# Patient Record
Sex: Female | Born: 1995 | Race: Black or African American | Hispanic: No | Marital: Single | Smoking: Never smoker
Health system: Southern US, Community
[De-identification: ages and names within clinical notes are randomized; demographics above are authoritative.]

## PROBLEM LIST (undated history)

## (undated) DIAGNOSIS — Z789 Other specified health status: Secondary | ICD-10-CM

## (undated) HISTORY — DX: Other specified health status: Z78.9

## (undated) HISTORY — PX: NO PAST SURGERIES: SHX2092

---

## 2014-03-14 ENCOUNTER — Emergency Department (HOSPITAL_COMMUNITY)
Admission: EM | Admit: 2014-03-14 | Discharge: 2014-03-14 | Disposition: A | Discharge status: Home / Self Care | Payer: No Typology Code available for payment source | Attending: Emergency Medicine | Admitting: Emergency Medicine

## 2014-03-14 ENCOUNTER — Emergency Department (HOSPITAL_COMMUNITY): Payer: No Typology Code available for payment source

## 2014-03-14 ENCOUNTER — Encounter (HOSPITAL_COMMUNITY): Payer: Self-pay | Admitting: *Deleted

## 2014-03-14 DIAGNOSIS — Y998 Other external cause status: Secondary | ICD-10-CM | POA: Insufficient documentation

## 2014-03-14 DIAGNOSIS — S80212A Abrasion, left knee, initial encounter: Secondary | ICD-10-CM | POA: Insufficient documentation

## 2014-03-14 DIAGNOSIS — Y92410 Unspecified street and highway as the place of occurrence of the external cause: Secondary | ICD-10-CM | POA: Diagnosis not present

## 2014-03-14 DIAGNOSIS — Y9389 Activity, other specified: Secondary | ICD-10-CM | POA: Insufficient documentation

## 2014-03-14 DIAGNOSIS — M25562 Pain in left knee: Secondary | ICD-10-CM

## 2014-03-14 DIAGNOSIS — S8992XA Unspecified injury of left lower leg, initial encounter: Secondary | ICD-10-CM | POA: Diagnosis present
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
## 2014-03-14 NOTE — Discharge Instructions (Signed)
Please call your doctor for a followup appointment within 24-48 hours. When you talk to your doctor please let them know that you were seen in the emergency department and have them acquire all of your records so that they can discuss the findings with you and formulate a treatment plan to fully care for your new and ongoing problems. Please call and set-up an appointment with your primary care provider Please call and set-up an appointment with orthopedics to follow up regarding left knee pain  Please rest and stay hydrated Please keep knee sleeve on for comfort purposes Please rest, ice, elevate - toes above nose  Please continue to monitor symptoms closely and if symptoms are to worsen or change (fever greater than 101, chills, sweating, nausea, vomiting, chest pain, shortness of breathe, difficulty breathing, weakness, numbness, tingling, worsening or changes to pain pattern, headache, dizziness, visual changes, fall, injury, inability to control urine or bowel movements, back pain, neck pain, neck stiffness) please report back to the Emergency Department immediately.    Arthralgia Your caregiver has diagnosed you as suffering from an arthralgia. Arthralgia means there is pain in a joint. This can come from many reasons including:  Bruising the joint which causes soreness (inflammation) in the joint.  Wear and tear on the joints which occur as we grow older (osteoarthritis).  Overusing the joint.  Various forms of arthritis.  Infections of the joint. Regardless of the cause of pain in your joint, most of these different pains respond to anti-inflammatory drugs and rest. The exception to this is when a joint is infected, and these cases are treated with antibiotics, if it is a bacterial infection. HOME CARE INSTRUCTIONS   Rest the injured area for as long as directed by your caregiver. Then slowly start using the joint as directed by your caregiver and as the pain allows. Crutches as  directed may be useful if the ankles, knees or hips are involved. If the knee was splinted or casted, continue use and care as directed. If an stretchy or elastic wrapping bandage has been applied today, it should be removed and re-applied every 3 to 4 hours. It should not be applied tightly, but firmly enough to keep swelling down. Watch toes and feet for swelling, bluish discoloration, coldness, numbness or excessive pain. If any of these problems (symptoms) occur, remove the ace bandage and re-apply more loosely. If these symptoms persist, contact your caregiver or return to this location.  For the first 24 hours, keep the injured extremity elevated on pillows while lying down.  Apply ice for 15-20 minutes to the sore joint every couple hours while awake for the first half day. Then 03-04 times per day for the first 48 hours. Put the ice in a plastic bag and place a towel between the bag of ice and your skin.  Wear any splinting, casting, elastic bandage applications, or slings as instructed.  Only take over-the-counter or prescription medicines for pain, discomfort, or fever as directed by your caregiver. Do not use aspirin immediately after the injury unless instructed by your physician. Aspirin can cause increased bleeding and bruising of the tissues.  If you were given crutches, continue to use them as instructed and do not resume weight bearing on the sore joint until instructed. Persistent pain and inability to use the sore joint as directed for more than 2 to 3 days are warning signs indicating that you should see a caregiver for a follow-up visit as soon as possible. Initially, a  hairline fracture (break in bone) may not be evident on X-rays. Persistent pain and swelling indicate that further evaluation, non-weight bearing or use of the joint (use of crutches or slings as instructed), or further X-rays are indicated. X-rays may sometimes not show a small fracture until a week or 10 days later.  Make a follow-up appointment with your own caregiver or one to whom we have referred you. A radiologist (specialist in reading X-rays) may read your X-rays. Make sure you know how you are to obtain your X-ray results. Do not assume everything is normal if you do not hear from [place]. SEEK MEDICAL CARE IF: Bruising, swelling, or pain increases. SEEK IMMEDIATE MEDICAL CARE IF:   Your fingers or toes are numb or blue.  The pain is not responding to medications and continues to stay the same or get worse.  The pain in your joint becomes severe.  You develop a fever over 102 F (38.9 C).  It becomes impossible to move or use the joint. MAKE SURE YOU:   Understand these instructions.  Will watch your condition.  Will get help right away if you are not doing well or get worse. Document Released: 02/25/2005 Document Revised: 05/20/2011 Document Reviewed: 10/14/2007 [facility] Patient Information 2015 [place], [place]. This information is not intended to replace advice given to you by your health care provider. Make sure you discuss any questions you have with your health care provider.   Emergency Department Resource Guide 1) Find a Doctor and Pay Out of Pocket Although you won't have to find out who is covered by your insurance plan, it is a good idea to ask around and get recommendations. You will then need to call the office and see if the doctor you have chosen will accept you as a new patient and what types of options they offer for patients who are self-pay. Some doctors offer discounts or will set up payment plans for their patients who do not have insurance, but you will need to ask so you aren't surprised when you get to your appointment.  2) Contact Your Local Health Department Not all health departments have doctors that can see patients for sick visits, but many do, so it is worth a call to see if yours does. If you don't know where your local health department is, you can check in your  phone book. The CDC also has a tool to help you locate your state's health department, and many state websites also have listings of all of their local health departments.  3) Find a Walk-in Clinic If your illness is not likely to be very severe or complicated, you may want to try a walk in clinic. These are popping up all over the country in pharmacies, drugstores, and shopping centers. They're usually staffed by nurse practitioners or physician assistants that have been trained to treat common illnesses and complaints. They're usually fairly quick and inexpensive. However, if you have serious medical issues or chronic medical problems, these are probably not your best option.  No Primary Care Doctor: - Call Health Connect at  [phone] - they can help you locate a primary care doctor that  accepts your insurance, provides certain services, etc. - Physician Referral Service- [phone]  Chronic Pain Problems: Organization         Address  Phone   Notes  [facility]  [phone] Patients need to be referred by their primary care doctor.   Medication Assistance: Organization  Address  Phone   Notes  [facility] [address] [place], [place] [address] [phone] --Must be a resident of [facility] -- Must have NO insurance coverage whatsoever (no Medicaid/ Medicare, etc.) -- The pt. MUST have a primary care doctor that directs their care regularly and follows them in the community   MedAssist  [phone]   [employer]  [phone]    Agencies that provide inexpensive medical care: Organization         Address  Phone   Notes  [place]  [phone]   [facility]    [phone]   [facility] [place], [place] [address] [phone]   [place] [address], [place] [phone]   [facility]    [phone]   [facility]    [phone]   [place] [address], [place] Phone:  [phone], Fax:  [phone] Hours of Operation:  9 am - 6 pm, M-F.  Also accepts Medicaid/Medicare and self-pay.  [facility] for [place], [address], [place] Phone: [phone], Fax: [phone]. Hours of Operation:  8:30 am - 5:30 pm, M-F.  Also accepts Medicaid and self-pay.  [facility] [place] [address], [place] Phone: [phone]   [place], [place], [place] [phone], Ext. [phone] Mondays & Thursdays: 7-9 AM.  First 15 patients are seen on a first come, first serve basis.    [place] Providers:  Organization         Address  Phone   Notes  [facility] [address], [place] [phone] Also accepts self-pay patients.  [facility] [address], [place]  [phone]   [place], [address], [place] [phone]   [facility] [address] [place] [phone]   Lucianne Lei [address], [place]   [phone] Only accepts [place] Access [place] patients after they have their name applied to their card.   Self-Pay (no insurance) in [facility]:  Organization         Address  Phone   Notes  Sickle Cell Patients, [facility] [place] [phone]   [facility] [place] [phone]   [facility] [place]  [place], [place], [place] [phone]   [facility]/Dr. Osei-Bonsu  [address] [place] or [address], [place] [phone] Phone number for both [place] and [place] locations is the same.    [facility] [address], [place] [phone]   [facility] [address], [place] or [address] [phone]   [facility] [address] [place] [phone], phone; [phone], fax Sees patients 1st and 3rd Saturday of every month.  Must not qualify for public or private insurance (i.e. Medicaid, Medicare, [place] Health Choice, Veterans' Benefits)  Household income should be no more than 200% of the poverty level The clinic cannot treat you if you are pregnant or think  you are pregnant  Sexually transmitted diseases are not treated at the clinic.    Dental Care: Organization         Address  Phone  Notes  [facility] [place] [phone] Accepts children up to age 18 who are enrolled in [place] or [place]; pregnant women with a Medicaid card; and children who have applied for Medicaid or [place] Health Choice, but were declined, whose parents can pay a reduced fee at time of service.  [facility] of [facility]  [address], [place] [phone] Accepts children up to age 65 who are enrolled in [place] or [place]; pregnant women with a Medicaid card; and children who have applied for Medicaid or [place] Health Choice, but were declined, whose parents can pay a reduced fee at time of service.  [place] Adult [facility] Access PROGRAM  [place] [phone] Patients are seen by appointment only. Walk-ins are not accepted. [place] will see patients 62 years of age and older. Monday - Tuesday (8am-5pm) Most Wednesdays (8:30-5pm) $30 per visit, cash only  [facility]  [address], [facility] [phone] Patients are seen by appointment only. Walk-ins are not accepted. [place] will see patients 59  years of age and older. One Wednesday Evening (Monthly: Volunteer Based).  $30 per visit, cash only  [place]  [phone] for adults; Children under age 77, call Graduate Pediatric Dentistry at [phone]. Children aged 59-14, please call [phone] to request a pediatric application.  Dental services are provided in all areas of dental care including fillings, crowns and bridges, complete and partial dentures, implants, gum treatment, root canals, and extractions. Preventive care is also provided. Treatment is provided to both adults and children. Patients are selected via a lottery and there is often a waiting list.   [facility] [address], [place]  [phone] [email or web address]   [facility] [address] [place], [place] [phone], Ext. [phone] Second and Fourth Thursday of each month, opens at 6:30 AM; Clinic ends at 9 AM.  Patients are seen on a first-come first-served basis, and a limited number are seen during each clinic.   [facility]  [address] [place], [place] [phone]   Eligibility Requirements You must have lived in [place], [place], or [place] counties for at least the last three months.   You cannot be eligible for state or federal sponsored [employer], including [employer], [place], or [employer].   You generally cannot be eligible for healthcare insurance through your employer.    How to apply: Eligibility screenings are held every Tuesday and Wednesday afternoon from 1:00 pm until 4:00 pm. You do not need an appointment for the interview!  [facility] [address] [place], [place]   [place]  [place] Department  [place]  [phone]    Behavioral Health Resources in the Community: Intensive Outpatient  Programs Organization         Address  Phone  Notes  [place]. [address] [place], [place] [phone]   [facility] [address], [place], [place]   ADS: Alcohol & Drug Svcs 119  [address] [place], [place]   [place] [phone] [address]  [place], [place] or [phone]   Substance Abuse Resources Organization         Address  Phone  Notes  Al[facility] Drug Services  [phone]   [place]  [phone]   The [place]   Chinita Pester  [phone]   Residential & Outpatient Substance Abuse Program  [phone]   Psychological Services Organization         Address  Phone  Notes  [facility] [place]  [place]  [phone]   [place] [address], [place] or [phone]    Mobile Crisis Teams Organization         Address  Phone  Notes  [facility], Mobile Crisis Care Unit  [phone]   [facility]  [address] [place], [place]   [place] [address] [place] [phone]    Self-Help/Support Groups Organization         Address  Phone             Notes  [place]. of [place] - variety of support groups  [place] Call for more information  Narcotics Anonymous (NA), [facility] [address], [employer] [place]  2 meetings at this location   Special educational needs teacher         Address  Phone  Notes  ASAP Residential Treatment [place],    [place]  [phone]   [facility]  [address] [place] [id number], [place], [place]   [place], [place] [phone] Admissions: 8am-3pm M-F  Incentives Substance Medicine [place] [address],    [place], [place]  [phone]   [facility] [address] [place], [place], [place]   [facility] [address]  [place], [place]   Insight Programs - Intensive Outpatient [address] Kristeen [address], [place], [place]   [facility] [address].,  [place], [place] [phone] or [phone]   Residential Treatment Services (RTS) [address] [place], [place] Accepts Medicaid  [facility] [place] [address],  [place] [phone] Substance Abuse/Addiction Treatment   [facility] Organization         Address  Phone  Notes  [facility]  [phone]   Domenic Schwab, PhD [address] [place], [place]   [phone] or [phone]   [place], [place] [phone]   [facility] [address] [place], [place] [phone] Insurance/Medicaid/sponsorship through [facility] and Families [address] [place]                                    [place], [place] [phone] [place] [address], [place] [phone]    Dr. Adele Schilder  [phone]   [facility] Dept. 1) [address] [place] 2) [place] 3)  [place] [address], [place] [phone]  [phone]   [place] (  [phone] [phone] or [phone] (After Hours)
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
## 2015-05-31 ENCOUNTER — Emergency Department (HOSPITAL_BASED_OUTPATIENT_CLINIC_OR_DEPARTMENT_OTHER): Payer: Self-pay

## 2015-05-31 ENCOUNTER — Emergency Department (HOSPITAL_BASED_OUTPATIENT_CLINIC_OR_DEPARTMENT_OTHER)
Admission: EM | Admit: 2015-05-31 | Discharge: 2015-05-31 | Disposition: A | Discharge status: Home / Self Care | Payer: Self-pay | Attending: Emergency Medicine | Admitting: Emergency Medicine

## 2015-05-31 ENCOUNTER — Encounter (HOSPITAL_BASED_OUTPATIENT_CLINIC_OR_DEPARTMENT_OTHER): Payer: Self-pay | Admitting: Emergency Medicine

## 2015-05-31 DIAGNOSIS — Z3202 Encounter for pregnancy test, result negative: Secondary | ICD-10-CM | POA: Insufficient documentation

## 2015-05-31 DIAGNOSIS — W19XXXA Unspecified fall, initial encounter: Secondary | ICD-10-CM

## 2015-05-31 DIAGNOSIS — Y92481 Parking lot as the place of occurrence of the external cause: Secondary | ICD-10-CM | POA: Insufficient documentation

## 2015-05-31 DIAGNOSIS — S42402A Unspecified fracture of lower end of left humerus, initial encounter for closed fracture: Secondary | ICD-10-CM

## 2015-05-31 DIAGNOSIS — W010XXA Fall on same level from slipping, tripping and stumbling without subsequent striking against object, initial encounter: Secondary | ICD-10-CM | POA: Insufficient documentation

## 2015-05-31 DIAGNOSIS — S42352A Displaced comminuted fracture of shaft of humerus, left arm, initial encounter for closed fracture: Secondary | ICD-10-CM | POA: Insufficient documentation

## 2015-05-31 DIAGNOSIS — Y9389 Activity, other specified: Secondary | ICD-10-CM | POA: Insufficient documentation

## 2015-05-31 DIAGNOSIS — Y998 Other external cause status: Secondary | ICD-10-CM | POA: Insufficient documentation

## 2015-05-31 LAB — PREGNANCY, URINE: Preg Test, Ur: NEGATIVE

## 2015-05-31 MED ORDER — HYDROCODONE-ACETAMINOPHEN 5-325 MG PO TABS: 1.0000 | ORAL_TABLET | ORAL | Status: DC | PRN

## 2015-05-31 MED ORDER — HYDROCODONE-ACETAMINOPHEN 5-325 MG PO TABS
2.0000 | ORAL_TABLET | Freq: Once | ORAL | Status: AC
  Administered 2015-05-31: 2 via ORAL
  Filled 2015-05-31: qty 2

## 2015-05-31 NOTE — Discharge Instructions (Signed)
Humerus Fracture Treated With Immobilization °The humerus is the large bone in your upper arm. You have a broken (fractured) humerus. These fractures are easily diagnosed with X-rays. °TREATMENT  °Simple fractures which will heal without disability are treated with simple immobilization. Immobilization means you will wear a cast, splint, or sling. You have a fracture which will do well with immobilization. The fracture will heal well simply by being held in a good position until it is stable enough to begin range of motion exercises. Do not take part in activities which would further injure your arm.  °HOME CARE INSTRUCTIONS  °· Put ice on the injured area. °¨ Put ice in a plastic bag. °¨ Place a towel between your skin and the bag. °¨ Leave the ice on for 15-20 minutes, 03-04 times a day. °· If you have a cast: °¨ Do not scratch the skin under the cast using sharp or pointed objects. °¨ Check the skin around the cast every day. You may put lotion on any red or sore areas. °¨ Keep your cast dry and clean. °· If you have a splint: °¨ Wear the splint as directed. °¨ Keep your splint dry and clean. °¨ You may loosen the elastic around the splint if your fingers become numb, tingle, or turn cold or blue. °· If you have a sling: °¨ Wear the sling as directed. °· Do not put pressure on any part of your cast or splint until it is fully hardened. °· Your cast or splint can be protected during bathing with a plastic bag. Do not lower the cast or splint into water. °· Only take over-the-counter or prescription medicines for pain, discomfort, or fever as directed by your caregiver. °· Do range of motion exercises as instructed by your caregiver. °· Follow up as directed by your caregiver. This is very important in order to avoid permanent injury or disability and chronic pain. °SEEK IMMEDIATE MEDICAL CARE IF:  °· Your skin or nails in the injured arm turn blue or gray. °· Your arm feels cold or numb. °· You develop severe pain  in the injured arm. °· You are having problems with the medicines you were given. °MAKE SURE YOU:  °· Understand these instructions. °· Will watch your condition. °· Will get help right away if you are not doing well or get worse. °  °This information is not intended to replace advice given to you by your health care provider. Make sure you discuss any questions you have with your health care provider. °  °Document Released: 06/03/2000 Document Revised: 03/18/2014 Document Reviewed: 07/20/2014 °Elsevier Interactive Patient Education ©2016 [employer] ° °

## 2015-05-31 NOTE — ED Notes (Signed)
Pt states she tripped and fell on pants and injured her left elbow. Decreased ROM, skin intact.

## 2015-05-31 NOTE — ED Provider Notes (Signed)
CSN: [id number]     Arrival date & time 05/31/15  1859 History  By signing my name below, I, Miranda Ortiz, attest that this documentation has been prepared under the direction and in the presence of Loren Racer, MD. Electronically Signed: Ronney Ortiz, ED Scribe. 05/31/2015. 10:11 PM.    Chief Complaint  Patient presents with  . Fall  . Elbow Pain   The history is provided by the patient. No language interpreter was used.    HPI Comments: Miranda Ortiz is a 20 y.o. female who presents to the Emergency Department complaining of sudden-onset, constant, severe, aching left humerus pain after tripping and falling on her pants in the parking lot and landing with most of the impact on her left elbow about 4 hours ago, at 6 PM. Movement and palpation exacerbate her pain. She denies any shoulder pain, numbness, or tingling.   History reviewed. No pertinent past medical history. History reviewed. No pertinent past surgical history. No family history on file. Social History  Substance Use Topics  . Smoking status: Never Smoker   . Smokeless tobacco: None  . Alcohol Use: No   OB History    No data available     Review of Systems  Constitutional: Negative for fever and chills.  Gastrointestinal: Negative for abdominal pain.  Musculoskeletal: Positive for arthralgias (left elbow pain). Negative for neck pain and neck stiffness.  Skin: Negative for rash and wound.  Neurological: Negative for weakness and numbness.  All other systems reviewed and are negative.  Allergies  Review of patient's allergies indicates no known allergies.  Home Medications   Prior to Admission medications   Medication Sig Start Date End Date Taking? Authorizing Provider  HYDROcodone-acetaminophen (NORCO) 5-325 MG tablet Take 1-2 tablets by mouth every 4 (four) hours as needed for severe pain. 05/31/15   Loren Racer, MD   BP 100/60 mmHg  Pulse 71  Temp(Src) 99.5 F (37.5 C) (Oral)  Resp 18  Ht  (1.6  m)  Wt 127 lb 9 oz (57.862 kg)  BMI 22.60 kg/m2  SpO2 100%  LMP 05/31/2015 Physical Exam  Constitutional: She is oriented to person, place, and time. She appears well-developed and well-nourished. No distress.  HENT:  Head: Normocephalic and atraumatic.  Eyes: EOM are normal. Pupils are equal, round, and reactive to light.  Neck: Normal range of motion. Neck supple.  Cardiovascular: Normal rate and regular rhythm.   Pulmonary/Chest: Effort normal and breath sounds normal.  Abdominal: Soft. Bowel sounds are normal.  Musculoskeletal: Normal range of motion. She exhibits tenderness. She exhibits no edema.  Patient has midshaft humeral tenderness on the left with swelling present. No tenderness over the left elbow. Radial pulse is intact. Compartments are soft.  Neurological: She is alert and oriented to person, place, and time.  Normal left grip strength. Sensation fully intact.  Skin: Skin is warm and dry. No rash noted. No erythema.  Psychiatric: She has a normal mood and affect. Her behavior is normal.  Nursing note and vitals reviewed.   ED Course  Procedures (including critical care time)  DIAGNOSTIC STUDIES: Oxygen Saturation is 100% on RA, normal by my interpretation.    COORDINATION OF CARE: 10:09 PM - Discussed treatment plan with pt at bedside which includes awaiting x-ray results. Pt verbalized understanding and agreed to plan.   Labs Review Labs Reviewed  PREGNANCY, URINE    Imaging Review Dg Humerus Left  05/31/2015  CLINICAL DATA:  Fall tonight. Left humerus pain with  swelling. Initial encounter. EXAM: LEFT HUMERUS - 2+ VIEW COMPARISON:  None. FINDINGS: There is an oblique, comminuted midshaft humerus fracture. The main distal fragment demonstrates approximately 1 shaft width anterior displacement and 0.5 shaft width medial displacement relative to the main proximal fragment. The shoulder and elbow are grossly located. No suspicious lytic or blastic osseous lesion is  identified. No focal soft tissue abnormality. IMPRESSION: Comminuted, displaced midshaft left humerus fracture. Electronically Signed   By: Sebastian AcheAllen  Grady M.D.   On: 05/31/2015 22:20   I have personally reviewed and evaluated these images and lab results as part of my medical decision-making.   EKG Interpretation None      MDM   Final diagnoses:  Humerus distal fracture, left, closed, initial encounter    .I personally performed the services described in this documentation, which was scribed in my presence. The recorded information has been reviewed and is accurate.   Discussed x-ray results with Dr. Eulah PontMurphy. Recommends coaptation splint and having the patient follow-up in his office on Monday. Splint applied in the emergency department and discharged home with pain medication. Return precautions given.    Loren Raceravid Jaxxon Naeem, MD 05/31/15 2241
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
## 2016-11-12 IMAGING — CR DG HUMERUS 2V *L*
2 series · 2 of 2 positions shown · non-contrast
Comparison: None.

CLINICAL DATA: Fall tonight. Left humerus pain with swelling.
Initial encounter.

EXAM:
LEFT HUMERUS - 2+ VIEW

[w humerus lat left *]
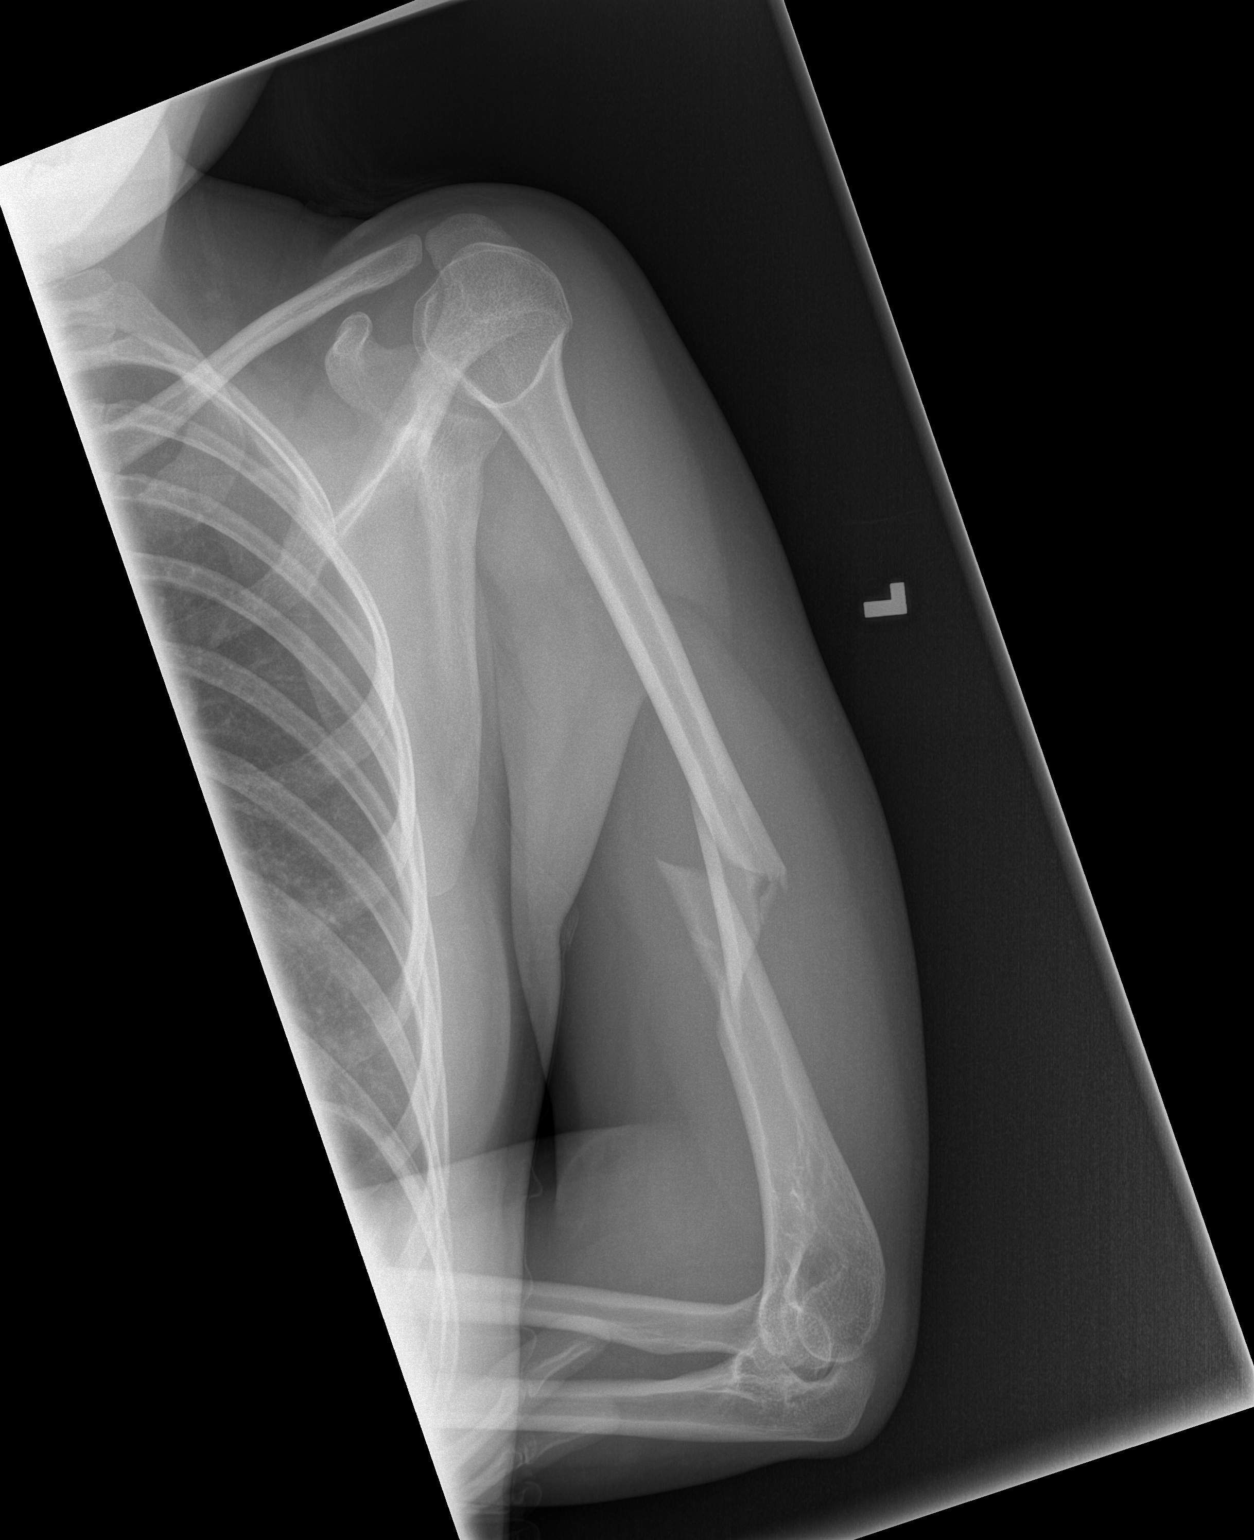

[w humerus ap left *]
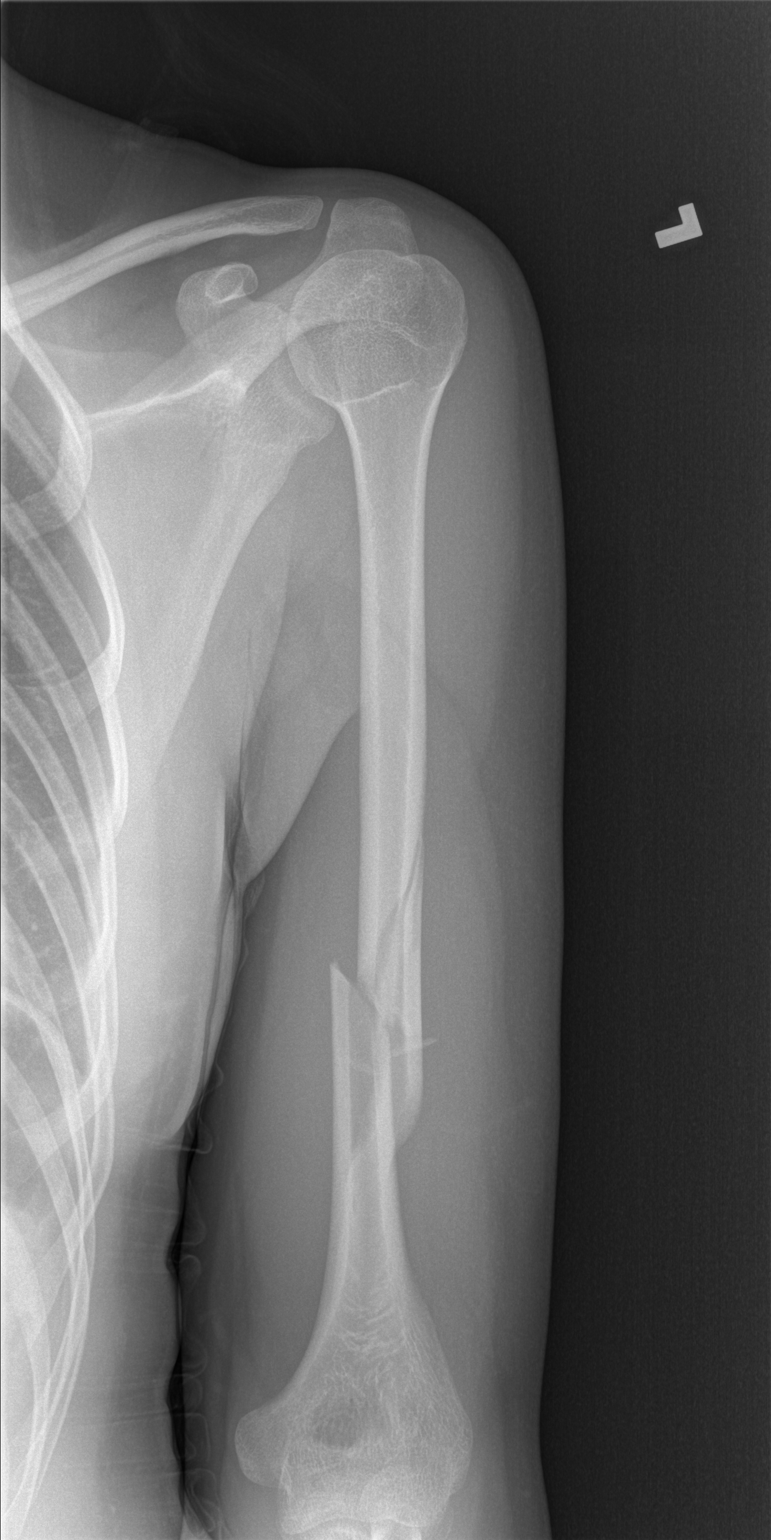

[2 of 2 positions shown; findings below may reference images not displayed]

FINDINGS: There is an oblique, comminuted midshaft humerus fracture. The main
distal fragment demonstrates approximately 1 shaft width anterior
displacement and 0.5 shaft width medial displacement relative to the
main proximal fragment. The shoulder and elbow are grossly located.
No suspicious lytic or blastic osseous lesion is identified. No
focal soft tissue abnormality.
IMPRESSION: Comminuted, displaced midshaft left humerus fracture.

## 2021-12-17 LAB — OB RESULTS CONSOLE RPR: RPR: NONREACTIVE

## 2021-12-17 LAB — OB RESULTS CONSOLE ABO/RH: RH Type: POSITIVE

## 2021-12-17 LAB — OB RESULTS CONSOLE HEPATITIS B SURFACE ANTIGEN: Hepatitis B Surface Ag: NEGATIVE

## 2021-12-17 LAB — OB RESULTS CONSOLE RUBELLA ANTIBODY, IGM: Rubella: NON-IMMUNE/NOT IMMUNE

## 2021-12-17 LAB — HEPATITIS C ANTIBODY: HCV Ab: NEGATIVE

## 2021-12-17 LAB — OB RESULTS CONSOLE HIV ANTIBODY (ROUTINE TESTING): HIV: NONREACTIVE

## 2021-12-19 LAB — OB RESULTS CONSOLE GC/CHLAMYDIA
Chlamydia: NEGATIVE
Neisseria Gonorrhea: NEGATIVE

## 2022-03-11 NOTE — L&D Delivery Note (Signed)
NVD NOTE Date:    Delivering Physician:  Bing Matter DO Anesthesia:   None  Pre-Delivery Diagnosis:   1. 27 y.o. female G1P0 at 27w5d2. Spontaneous preterm labor 3. Rubella non-immune 4. Asthma - rescue inhaler 5. GERD - protonix   Post-Delivery Diagnosis:   Same  Delivery of liveborn female neonate  Procedure:  Spontaneous vaginal delivery  QBL: 55 mL  Complications:  None  LABOR AND DELIVERY SUMMARY Miranda Ortiz a 27y.o. female G1P0 at 326w5ddmitted for spontaneous preterm labor. Patient was complaining of contractions at OV today and noted to be 1 cm, dilated at 1200. Upon presentation to MAU patient very uncomfortable with contractions and 5 cm dilated at 1312 with bulging membranes. Patient was admitted and transferred to L&D at which point she was completely dilated with the urge to push at 1325. Suspect SROM during transfer from MAU to L&D, clear fluid approximately 1320.  I presented to bedside immediately and patient began pushing began at 1328.  A liveborn female neonate was delivered via spontaneous vaginal delivery over an intact perineum from the LOA position at 1334.  There was no nuchal cord.  Spontaneous cry was noted.  No shoulder dystocia was encountered.  Infant was placed on the maternal abdomen immediately following delivery.  Oropharynx and nasopharynx were bulb suctioned immediately following delivery.  Cord was doubly clamped and cut.  Infant was transferred to a warmer with an awaiting RN.  Cord blood and cord gases obtained.  Pitocin was added to the patient's IV.  The placenta delivered spontaneously and apparently intact with a 3 vessel cord at 1339 and discarded.  Uterus was noted to be firm.  2nd degree perineal laceration was closed in the usual fashion with 3-0 vicryl suture and 1% lidocaine.  No additional cervical, vaginal, labial or periuretheral lacerations were identified.  All sponge, needle and instrument counts were correct at the end of  the procedure.  The patient and baby remained in the LDR in stable condition. Dr. OgIvin Pootwas present for the entire delivery.  [facility]:   Preterm labor: No data found  Rupture date: No data found  Rupture time: No data found  Rupture type: Intact Bulging bag of water  Fluid Color: No data found  Induction: No data found  Augmentation: No data found  Complications: No data found  Cervical ripening: No data found No data found   No data found     Delivery:   Episiotomy: No data found  Lacerations: No data found  Repair suture: No data found  Repair # of packets: No data found  Blood loss (ml): 55   Information for the patient's newborn:  Miranda Ortiz, [id number] Delivery 05/13/2022 1:34 PM by  Vaginal, Spontaneous Sex:  female Gestational Age: [id number] Clinician:   Living?:         APGARS  One minute Five minutes Ten minutes  Skin color:        Heart rate:        Grimace:        Muscle tone:        Breathing:        Totals: 9  9      Presentation/position:      Resuscitation:   Cord information:    Disposition of cord blood:     Blood gases sent?  Complications:   Placenta: Delivered:  appearance Newborn Measurements: Weight: 4 lb 10.1 oz (2100 g)  Height: 18"  Head circumference:    Chest circumference:    Other providers:    Additional  information: Forceps:   Vacuum:   Breech:   Observed anomalies        Dr. Bing Matter

## 2022-04-03 ENCOUNTER — Other Ambulatory Visit: Payer: Self-pay | Admitting: Obstetrics and Gynecology

## 2022-04-03 ENCOUNTER — Other Ambulatory Visit: Payer: Self-pay

## 2022-04-03 DIAGNOSIS — Z8279 Family history of other congenital malformations, deformations and chromosomal abnormalities: Secondary | ICD-10-CM

## 2022-04-30 ENCOUNTER — Encounter: Payer: Self-pay | Admitting: *Deleted

## 2022-05-01 ENCOUNTER — Other Ambulatory Visit: Payer: Self-pay | Admitting: Maternal & Fetal Medicine

## 2022-05-01 ENCOUNTER — Ambulatory Visit (HOSPITAL_BASED_OUTPATIENT_CLINIC_OR_DEPARTMENT_OTHER): Payer: Medicaid Other | Admitting: Maternal & Fetal Medicine

## 2022-05-01 ENCOUNTER — Encounter: Payer: Self-pay | Admitting: *Deleted

## 2022-05-01 ENCOUNTER — Ambulatory Visit: Payer: Medicaid Other | Admitting: *Deleted

## 2022-05-01 ENCOUNTER — Other Ambulatory Visit: Payer: Self-pay | Admitting: Obstetrics and Gynecology

## 2022-05-01 ENCOUNTER — Ambulatory Visit: Payer: Medicaid Other | Attending: Obstetrics and Gynecology

## 2022-05-01 DIAGNOSIS — O365930 Maternal care for other known or suspected poor fetal growth, third trimester, not applicable or unspecified: Secondary | ICD-10-CM

## 2022-05-01 DIAGNOSIS — O365990 Maternal care for other known or suspected poor fetal growth, unspecified trimester, not applicable or unspecified: Secondary | ICD-10-CM

## 2022-05-01 DIAGNOSIS — Z3A34 34 weeks gestation of pregnancy: Secondary | ICD-10-CM | POA: Diagnosis not present

## 2022-05-01 DIAGNOSIS — Z363 Encounter for antenatal screening for malformations: Secondary | ICD-10-CM | POA: Diagnosis not present

## 2022-05-01 DIAGNOSIS — Z8279 Family history of other congenital malformations, deformations and chromosomal abnormalities: Secondary | ICD-10-CM

## 2022-05-01 DIAGNOSIS — O365931 Maternal care for other known or suspected poor fetal growth, third trimester, fetus 1: Secondary | ICD-10-CM | POA: Diagnosis not present

## 2022-05-01 NOTE — Progress Notes (Signed)
MFM Consult Note Patient Name: Miranda Ortiz  Patient MRN:   [id number]  Referring provider: [place]  Reason for Consult: family history of congenital heart disease, new onset FGR   HPI: Miranda Ortiz is a 27 y.o. G1P0 at 68w0dhere for ultrasound and consultation.   The patient was referred for history of paternal congenital heart disease.  She is uncertain of the exact type of heart defect but believes that it was minor and the defect "closed up on its own ".  She personally does not have a history of heart disease.   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  I discussed that the risk  to the fetus is less likely when the father of the baby has the congenital heart disease compared to the mother.  I suspect that the chance of congenital heart disease is less than 5% based on the a priori risk due to family history.  I discussed that today the heart views appear normal but are limited due to advanced gestation.  Given the gestational age I do not think a fetal echo will be greatly beneficial and the neonate could be evaluated after birth.   I discussed that there is fetal growth restriction on today's ultrasound due to the abdominal circumference measuring less than the 10th percentile.  The biophysical profile was 8 out of 8.  The umbilical artery Dopplers were normal.  I discussed the various causes of fetal growth restriction with the most likely being either constitutional or placental insufficiency.  We discussed that the timing of ultrasound frequency will need to be increased to weekly biophysical profiles and umbilical artery Doppler assessment.  I discussed the timing of delivery is typically around 38 to 39 weeks pending the clinical course.  She verbalized understanding and agrees to the plan outlined below.  Review of Systems: A review of systems was performed and was negative except per HPI   Vitals and Physical Exam BP 106/55, HR 83, pregravid BMI: 26.58.   Sitting comfortably on the sonogram table Nonlabored breathing Normal rate and rhythm Abdomen is nontender  Genetic testing: low risk NIPS  Sonographic findings Single intrauterine pregnancy. Fetal cardiac activity:  Observed and appears normal. Presentation: Cephalic. Interval fetal anatomy appears normal. Fetal biometry shows the estimated fetal weight at the 15 percentile and the abdominal circumference at the 7th percentile.  Much of the anatomy remains poorly visualized due to limited acoustic windows from advanced gestation and fetal position.  Amniotic fluid volume: Within normal limits. AFI: 16.76 cm.  MVP: 6.18 cm. Placenta: Anterior. Umbilical artery dopplers findings: -S/D:2.24 which are normal at this gestational age.  -Absent end-diastolic flow: No.  -Reversed end-diastolic flow:  No. BPP 8/8.   There are limitations of prenatal ultrasound such as the inability to detect certain abnormalities due to poor visualization. Various factors such as fetal position, gestational age and maternal body habitus may increase the difficulty in visualizing the fetal anatomy.    Assessment - FGR (EFW 15th %, AC 7th %, UD dopplers: normal) at [redacted]w[redacted]d Family hx of CHD (FOB with VSD?) Plan - Weekly UA dopplers and BPP until delivery. - Serial growth US[place]oreavery 3 weeks until delivery. - Delivery likely around 38 to 39 weeks or sooner if indicated.  - The heart structures were not well visualized due to advanced gestational age. Referral for fetal echo can be considered, but since the patient had a normal appearing heart exam at her referring providers office,  a fetal echo at this gestational age will likely be of little benefit. I discussed this with the patient and she declined fetal echo.    I spent 30 minutes reviewing the patients chart, including labs and images as well as counseling the patient about her medical conditions.  Miranda Ortiz  MFM, [place]   05/01/2022  3:54 PM

## 2022-05-09 ENCOUNTER — Ambulatory Visit: Payer: Medicaid Other

## 2022-05-09 ENCOUNTER — Other Ambulatory Visit: Payer: Medicaid Other

## 2022-05-09 ENCOUNTER — Ambulatory Visit: Payer: Medicaid Other | Attending: Maternal & Fetal Medicine | Admitting: *Deleted

## 2022-05-09 ENCOUNTER — Ambulatory Visit (HOSPITAL_BASED_OUTPATIENT_CLINIC_OR_DEPARTMENT_OTHER): Payer: Medicaid Other

## 2022-05-09 ENCOUNTER — Encounter: Payer: Self-pay | Admitting: *Deleted

## 2022-05-09 DIAGNOSIS — Z8279 Family history of other congenital malformations, deformations and chromosomal abnormalities: Secondary | ICD-10-CM | POA: Diagnosis not present

## 2022-05-09 DIAGNOSIS — O365930 Maternal care for other known or suspected poor fetal growth, third trimester, not applicable or unspecified: Secondary | ICD-10-CM

## 2022-05-09 DIAGNOSIS — R011 Cardiac murmur, unspecified: Secondary | ICD-10-CM | POA: Insufficient documentation

## 2022-05-09 DIAGNOSIS — Z363 Encounter for antenatal screening for malformations: Secondary | ICD-10-CM | POA: Insufficient documentation

## 2022-05-09 DIAGNOSIS — Z3A35 35 weeks gestation of pregnancy: Secondary | ICD-10-CM

## 2022-05-13 ENCOUNTER — Other Ambulatory Visit: Payer: Self-pay

## 2022-05-13 ENCOUNTER — Inpatient Hospital Stay (HOSPITAL_COMMUNITY)
Admission: AD | Admit: 2022-05-13 | Discharge: 2022-05-15 | DRG: 807 | Disposition: A | Discharge status: Home / Self Care | Payer: Medicaid Other | Attending: Obstetrics and Gynecology | Admitting: Obstetrics and Gynecology

## 2022-05-13 ENCOUNTER — Encounter (HOSPITAL_COMMUNITY): Payer: Self-pay | Admitting: Obstetrics and Gynecology

## 2022-05-13 DIAGNOSIS — J45909 Unspecified asthma, uncomplicated: Secondary | ICD-10-CM | POA: Diagnosis present

## 2022-05-13 DIAGNOSIS — K219 Gastro-esophageal reflux disease without esophagitis: Secondary | ICD-10-CM | POA: Diagnosis present

## 2022-05-13 DIAGNOSIS — Z3A35 35 weeks gestation of pregnancy: Secondary | ICD-10-CM

## 2022-05-13 DIAGNOSIS — O9962 Diseases of the digestive system complicating childbirth: Secondary | ICD-10-CM | POA: Diagnosis present

## 2022-05-13 DIAGNOSIS — O9952 Diseases of the respiratory system complicating childbirth: Secondary | ICD-10-CM | POA: Diagnosis present

## 2022-05-13 LAB — CBC
HCT: 35 % — ABNORMAL LOW (ref 36.0–46.0)
Hemoglobin: 12 g/dL (ref 12.0–15.0)
MCH: 29.7 pg (ref 26.0–34.0)
MCHC: 34.3 g/dL (ref 30.0–36.0)
MCV: 86.6 fL (ref 80.0–100.0)
Platelets: 301 10*3/uL (ref 150–400)
RBC: 4.04 MIL/uL (ref 3.87–5.11)
RDW: 12.2 % (ref 11.5–15.5)
WBC: 13.8 10*3/uL — ABNORMAL HIGH (ref 4.0–10.5)
nRBC: 0 % (ref 0.0–0.2)

## 2022-05-13 LAB — TYPE AND SCREEN
ABO/RH(D): A POS
Antibody Screen: NEGATIVE

## 2022-05-13 MED ORDER — SIMETHICONE 80 MG PO CHEW: 80.0000 mg | CHEWABLE_TABLET | ORAL | Status: DC | PRN

## 2022-05-13 MED ORDER — ACETAMINOPHEN 325 MG PO TABS
650.0000 mg | ORAL_TABLET | ORAL | Status: DC | PRN
  Filled 2022-05-13: qty 2

## 2022-05-13 MED ORDER — SODIUM CHLORIDE 0.9 % IV SOLN: 1.0000 g | INTRAVENOUS | Status: DC

## 2022-05-13 MED ORDER — OXYCODONE-ACETAMINOPHEN 5-325 MG PO TABS: 2.0000 | ORAL_TABLET | ORAL | Status: DC | PRN

## 2022-05-13 MED ORDER — LACTATED RINGERS IV SOLN: INTRAVENOUS | Status: DC

## 2022-05-13 MED ORDER — LIDOCAINE HCL (PF) 1 % IJ SOLN
30.0000 mL | INTRAMUSCULAR | Status: AC | PRN
  Administered 2022-05-13: 30 mL via SUBCUTANEOUS
  Filled 2022-05-13: qty 30

## 2022-05-13 MED ORDER — FENTANYL CITRATE (PF) 100 MCG/2ML IJ SOLN
INTRAMUSCULAR | Status: AC
  Filled 2022-05-13: qty 2

## 2022-05-13 MED ORDER — ZOLPIDEM TARTRATE 5 MG PO TABS: 5.0000 mg | ORAL_TABLET | Freq: Every evening | ORAL | Status: DC | PRN

## 2022-05-13 MED ORDER — SENNOSIDES-DOCUSATE SODIUM 8.6-50 MG PO TABS
2.0000 | ORAL_TABLET | Freq: Every day | ORAL | Status: DC
  Administered 2022-05-14 – 2022-05-15 (×2): 2 via ORAL
  Filled 2022-05-13 (×2): qty 2

## 2022-05-13 MED ORDER — SODIUM CHLORIDE 0.9 % IV SOLN: 2.0000 g | Freq: Once | INTRAVENOUS | Status: DC

## 2022-05-13 MED ORDER — ONDANSETRON HCL 4 MG/2ML IJ SOLN: 4.0000 mg | Freq: Four times a day (QID) | INTRAMUSCULAR | Status: DC | PRN

## 2022-05-13 MED ORDER — FENTANYL CITRATE (PF) 100 MCG/2ML IJ SOLN
50.0000 ug | INTRAMUSCULAR | Status: DC | PRN
  Administered 2022-05-13: 100 ug via INTRAVENOUS
  Filled 2022-05-13: qty 2

## 2022-05-13 MED ORDER — ACETAMINOPHEN 325 MG PO TABS
650.0000 mg | ORAL_TABLET | ORAL | Status: DC | PRN
  Administered 2022-05-13 – 2022-05-14 (×3): 650 mg via ORAL
  Filled 2022-05-13 (×3): qty 2

## 2022-05-13 MED ORDER — OXYCODONE-ACETAMINOPHEN 5-325 MG PO TABS
1.0000 | ORAL_TABLET | ORAL | Status: DC | PRN
  Administered 2022-05-13: 1 via ORAL
  Filled 2022-05-13: qty 1

## 2022-05-13 MED ORDER — SOD CITRATE-CITRIC ACID 500-334 MG/5ML PO SOLN: 30.0000 mL | ORAL | Status: DC | PRN

## 2022-05-13 MED ORDER — BENZOCAINE-MENTHOL 20-0.5 % EX AERO
1.0000 | INHALATION_SPRAY | CUTANEOUS | Status: DC | PRN
  Administered 2022-05-13: 1 via TOPICAL
  Filled 2022-05-13: qty 56

## 2022-05-13 MED ORDER — OXYCODONE HCL 5 MG PO TABS: 10.0000 mg | ORAL_TABLET | ORAL | Status: DC | PRN

## 2022-05-13 MED ORDER — WITCH HAZEL-GLYCERIN EX PADS: 1.0000 | MEDICATED_PAD | CUTANEOUS | Status: DC | PRN

## 2022-05-13 MED ORDER — OXYCODONE HCL 5 MG PO TABS
5.0000 mg | ORAL_TABLET | ORAL | Status: DC | PRN
  Administered 2022-05-14: 5 mg via ORAL
  Filled 2022-05-13: qty 1

## 2022-05-13 MED ORDER — COCONUT OIL OIL: 1.0000 | TOPICAL_OIL | Status: DC | PRN

## 2022-05-13 MED ORDER — DIBUCAINE (PERIANAL) 1 % EX OINT
1.0000 | TOPICAL_OINTMENT | CUTANEOUS | Status: DC | PRN
  Filled 2022-05-13: qty 28

## 2022-05-13 MED ORDER — OXYTOCIN-SODIUM CHLORIDE 30-0.9 UT/500ML-% IV SOLN
INTRAVENOUS | Status: AC
  Filled 2022-05-13: qty 500

## 2022-05-13 MED ORDER — PRENATAL MULTIVITAMIN CH
1.0000 | ORAL_TABLET | Freq: Every day | ORAL | Status: DC
  Administered 2022-05-14 – 2022-05-15 (×2): 1 via ORAL
  Filled 2022-05-13 (×2): qty 1

## 2022-05-13 MED ORDER — TETANUS-DIPHTH-ACELL PERTUSSIS 5-2.5-18.5 LF-MCG/0.5 IM SUSY: 0.5000 mL | PREFILLED_SYRINGE | Freq: Once | INTRAMUSCULAR | Status: DC

## 2022-05-13 MED ORDER — DIPHENHYDRAMINE HCL 25 MG PO CAPS: 25.0000 mg | ORAL_CAPSULE | Freq: Four times a day (QID) | ORAL | Status: DC | PRN

## 2022-05-13 MED ORDER — LACTATED RINGERS IV SOLN: 500.0000 mL | INTRAVENOUS | Status: DC | PRN

## 2022-05-13 MED ORDER — OXYTOCIN BOLUS FROM INFUSION
333.0000 mL | Freq: Once | INTRAVENOUS | Status: AC
  Administered 2022-05-13: 333 mL via INTRAVENOUS

## 2022-05-13 MED ORDER — IBUPROFEN 600 MG PO TABS
600.0000 mg | ORAL_TABLET | Freq: Four times a day (QID) | ORAL | Status: DC
  Administered 2022-05-13 – 2022-05-15 (×8): 600 mg via ORAL
  Filled 2022-05-13 (×7): qty 1

## 2022-05-13 MED ORDER — ONDANSETRON HCL 4 MG/2ML IJ SOLN: 4.0000 mg | INTRAMUSCULAR | Status: DC | PRN

## 2022-05-13 MED ORDER — FLEET ENEMA 7-19 GM/118ML RE ENEM: 1.0000 | ENEMA | RECTAL | Status: DC | PRN

## 2022-05-13 MED ORDER — PANTOPRAZOLE SODIUM 20 MG PO TBEC
20.0000 mg | DELAYED_RELEASE_TABLET | Freq: Every day | ORAL | Status: DC
  Administered 2022-05-13 – 2022-05-15 (×3): 20 mg via ORAL
  Filled 2022-05-13 (×3): qty 1

## 2022-05-13 MED ORDER — ONDANSETRON HCL 4 MG PO TABS: 4.0000 mg | ORAL_TABLET | ORAL | Status: DC | PRN

## 2022-05-13 MED ORDER — MEASLES, MUMPS & RUBELLA VAC IJ SOLR: 0.5000 mL | Freq: Once | INTRAMUSCULAR | Status: DC

## 2022-05-13 MED ORDER — OXYTOCIN-SODIUM CHLORIDE 30-0.9 UT/500ML-% IV SOLN: 2.5000 [IU]/h | INTRAVENOUS | Status: DC

## 2022-05-13 NOTE — MAU Note (Signed)
.  Miranda Ortiz is a 27 y.o. at 18w5dhere in MAU reporting: ctx every 2 minutes since 0400 this morning. Denies VB or LOF. +FM   Pain score: 10 FHT:125

## 2022-05-13 NOTE — MAU Provider Note (Signed)
None     S Ms. Miranda Ortiz is a 27 y.o. G1P0 patient who presents to MAU today with complaint of contractions. Was seen in office - was 1cm. Sent here due to contractions. No leaking fluid. No complications during prenatal care. Received care with [facility].   O LMP 09/05/2021  Physical Exam Vitals reviewed.  Constitutional:      Appearance: Normal appearance.  Skin:    Capillary Refill: Capillary refill takes less than 2 seconds.  Neurological:     General: No focal deficit present.     Mental Status: She is alert.  Psychiatric:        Mood and Affect: Mood normal.        Behavior: Behavior normal.        Thought Content: Thought content normal.        Judgment: Judgment normal.    Dilation: 5 Effacement (%): 90 Presentation: Vertex Exam by:: Dr. Nehemiah Settle    A Medical screening exam complete labor  P admit  Truett Mainland, DO 05/13/2022 1:12 PM

## 2022-05-13 NOTE — Plan of Care (Signed)
Rosana Hoes, RN

## 2022-05-13 NOTE — H&P (Signed)
Miranda Ortiz is a 27 y.o. female G1P0 at 19w5dadmitted for spontaneous preterm labor. [facility] and noted to be 1 cm, dilated. Upon presentation to MAU patient very uncomfortable with contractions and 5 cm dilated. Patient was admitted and transferred to L&D.   OB History     Gravida  1   Para      Term      Preterm      AB      Living         SAB      IAB      Ectopic      Multiple      Live Births             Past Medical History:  Diagnosis Date   Medical history non-contributory    Past Surgical History:  Procedure Laterality Date   NO PAST SURGERIES     Family History: family history includes Diabetes in her maternal grandmother. Social History:  reports that she has never smoked. She does not have any smokeless tobacco history on file. She reports that she does not drink alcohol and does not use drugs.     Maternal Diabetes: No Genetic Screening: Normal, panorama low risk female and horizon negative Maternal Ultrasounds/Referrals: Normal Fetal Ultrasounds or other Referrals:  [facility] with heart murmur Maternal Substance Abuse:  No Significant Maternal Medications:  None Significant Maternal Lab Results:  None Number of Prenatal Visits:greater than 3 verified prenatal visits Other Comments:  None  Review of Systems  All other systems reviewed and are negative.  Maternal Medical History:  Reason for admission: Contractions.   Contractions: Onset was 6-12 hours ago.   Frequency: regular.   Duration is approximately 1 minute.   Perceived severity is strong.   Fetal activity: Perceived fetal activity is normal.     Dilation: 10 Effacement (%): 100 Exam by:: Dr. SNehemiah SettleBlood pressure 133/68, pulse 82, height '5\' 3"'$  (1.6 m), weight 91.6 kg, last menstrual period 09/05/2021. Maternal Exam:  Uterine Assessment: Contraction strength is firm.  Contraction duration is 1  minute. Contraction frequency is regular.  Abdomen: Patient reports no abdominal tenderness. Fetal presentation: vertex Introitus: Normal vulva. Pelvis: adequate for delivery.   Cervix: Cervix evaluated by digital exam.   5cm in MAU, complete +1 upon tranfser to L&D  Fetal Exam Fetal Monitor Review: Baseline rate: , difficulty tracing due to maternal discomfort.  Variability: moderate (6-25 bpm).   Fetal State Assessment: Category I - tracings are normal.   Physical Exam Vitals reviewed.  Cardiovascular:     Rate and Rhythm: Normal rate.  Pulmonary:     Effort: Pulmonary effort is normal. No respiratory distress.  Abdominal:     General: There is no distension.     Palpations: Abdomen is soft.     Tenderness: There is no abdominal tenderness. There is no guarding.  Genitourinary:    General: Normal vulva.  Skin:    General: Skin is warm and dry.  Neurological:     General: No focal deficit present.     Mental Status: She is alert and oriented to person, place, and time.  Psychiatric:        Mood and Affect: Mood normal.     Prenatal labs: ABO, Rh: --/--/PENDING (03/04 1315) Antibody: PENDING (03/04 1315) Rubella: Nonimmune (10/09 0000) RPR: Nonreactive (10/09 0000)  HBsAg: Negative (10/09 0000)  HIV: Non-reactive (10/09  0000)  GBS:   Unknown  Assessment/Plan: 27 y.o. female G1P0 at 5w5dSpontaneous preterm labor Rubella non-immune Asthma - rescue inhaler GERD - protonix  Admit to labor and delivery General admission labs Expectant management CEFM/TOCO GBS PCR Ampicillin for GBS unknown and preterm and advanced labor CLD MMR postpartum   ELennette BihariOgunbekun 05/13/2022, 2:17 PM

## 2022-05-14 ENCOUNTER — Ambulatory Visit: Payer: Medicaid Other

## 2022-05-14 LAB — CBC
HCT: 31.4 % — ABNORMAL LOW (ref 36.0–46.0)
Hemoglobin: 10.9 g/dL — ABNORMAL LOW (ref 12.0–15.0)
MCH: 29.9 pg (ref 26.0–34.0)
MCHC: 34.7 g/dL (ref 30.0–36.0)
MCV: 86.3 fL (ref 80.0–100.0)
Platelets: 257 10*3/uL (ref 150–400)
RBC: 3.64 MIL/uL — ABNORMAL LOW (ref 3.87–5.11)
RDW: 12.2 % (ref 11.5–15.5)
WBC: 12.5 10*3/uL — ABNORMAL HIGH (ref 4.0–10.5)
nRBC: 0 % (ref 0.0–0.2)

## 2022-05-14 LAB — RPR: RPR Ser Ql: NONREACTIVE

## 2022-05-14 NOTE — Progress Notes (Signed)
Post Partum Day 1 Subjective: no complaints, up ad lib, voiding, and tolerating PO  Objective: Blood pressure 106/68, pulse 72, temperature 98.6 F (37 C), temperature source Oral, resp. rate 16, height '5\' 3"'$  (1.6 m), weight 91.6 kg, last menstrual period 09/05/2021, unknown if currently breastfeeding.  Physical Exam:  General: alert, cooperative, and no distress Lochia: appropriate Uterine Fundus: firm Incision: N/A DVT Evaluation: No evidence of DVT seen on physical exam. No significant calf/ankle edema.  Recent Labs    05/13/22 1316 05/14/22 0524  HGB 12.0 10.9*  HCT 35.0* 31.4*    Assessment/Plan: 27 y/o G1P0101 PPD # 1 after preterm delivery at 35 weeks 5 days EGA, stable Plan for discharge tomorrow Defer circumcision as baby with feeding and temperature regulation issues Out of bed and ambulation encouraged.   LOS: 1 day   Archie Endo, MD 05/14/2022, 10:21 AM

## 2022-05-14 NOTE — Lactation Note (Signed)
This note was copied from a baby's chart. Lactation Consultation Note  Patient Name: Miranda Ortiz [id number] Date: 05/14/2022 Age:29 hours  Reason for consult: Initial assessment;Primapara;1st time breastfeeding;Infant < 6lbs;Breastfeeding assistance;Late-preterm 34-36.6wks  P1, 35.5 GA, BW: 4 lbs 10 oz, today's weight: 4 lbs 8 oz  The Late Preterm Feeding guidelines have been implemented by nursing staff. Mother has started pumping and crib card with feeding volumes given to parents along with instructions of infant care practices.   Infant fed by bottle 5 hours ago. Helped mother with getting started with bottle feeding using the extra slow nipple. Infant is sleepy and not showing feeding cues and other states "so I've been letting him sleep". Explained the need to feed baby every 3 hours to provide calories and energy. Breastfeeding was not attempted, mother to call when baby is more alert and showing cues to breastfeed. Encouraged to focus on bottle feeding at this time, skin to skin when parent is alert and awake and pumping every 3 hours and feeding any collected colostrum to baby. Curved tip syringe was given for drawing up small pumped volumes and informed mother to call RN/LC to assist mother with feeding using the syringe.   Feeding plan: Limit feeding to 30 min 1.Breastfeeding/ hand express when infant is engaged/interested 2. Supplement based on guidelines with Neosure 22 kcal 3. Pump q 3 hrs for 15 min     Maternal Data Does the patient have breastfeeding experience prior to this delivery?: No  Feeding Mother's Current Feeding Choice: Breast Milk and Formula Nipple Type: Extra Slow Flow   Lactation Tools Discussed/Used Tools: Pump;Flanges;Coconut oil;Bottle;Other (comment) (curved tip syringe for collecting expressed colostrum in bottle and feeding back to baby when small volumes) Flange Size: 24 Breast pump type: Double-Electric Breast Pump Pump Education: Setup,  frequency, and cleaning;Milk Storage Reason for Pumping: LPTI Pumping frequency: every 3 hours for 15 minutes  Interventions Interventions: Education;LC Services brochure  Discharge Pump: DEBP (will submit request for a stork pump, mother also has [facility])  Consult Status Consult Status: Follow-up Date: 05/15/22 Follow-up type: In-patient    Stana Bunting M 05/14/2022, 9:30 AM

## 2022-05-15 LAB — SURGICAL PATHOLOGY

## 2022-05-15 MED ORDER — IBUPROFEN 600 MG PO TABS: 600.0000 mg | ORAL_TABLET | Freq: Four times a day (QID) | ORAL | 0 refills | Status: AC

## 2022-05-15 MED ORDER — BENZOCAINE-MENTHOL 20-0.5 % EX AERO: 1.0000 | INHALATION_SPRAY | CUTANEOUS | 1 refills | Status: AC | PRN

## 2022-05-15 MED ORDER — ACETAMINOPHEN 325 MG PO TABS: 650.0000 mg | ORAL_TABLET | ORAL | 0 refills | Status: AC | PRN

## 2022-05-15 NOTE — Lactation Note (Signed)
This note was copied from a baby's chart. Lactation Consultation Note  Patient Name: Miranda Ortiz [id number] Date: 05/15/2022 Age:29 hours Reason for consult: Follow-up assessment;Primapara;1st time breastfeeding;Late-preterm 34-36.6wks  LC in to visit with P1 Mom of LPTI delivered vaginally.  Baby is currently at 3.8% weight loss and had 1 stool and 1 void last 24 hrs.    Working with SLP to help baby take more volume.  Baby is taking 3-10 ml.    Mom is pumping and last expression was 10 ml.  Encouraged STS and feeding baby at least every 3 hrs, waking as needed.  Mom in shower now and GMOB and FOB helping with baby who is waking early for a feeding.  Raquel Sarna SLP here to assess baby.   Lactation Tools Discussed/Used Tools: Pump;Flanges;Bottle Flange Size: 24 Breast pump type: Double-Electric Breast Pump;Manual Pump Education: Setup, frequency, and cleaning;Milk Storage Reason for Pumping: Support milk supply/LPTI <5 lbs Pumping frequency: Every 3 hrs Pumped volume: 10 mL   Consult Status Consult Status: Follow-up Date: 05/16/22 Follow-up type: [place] 05/15/2022, 11:04 AM

## 2022-05-15 NOTE — Progress Notes (Signed)
Patient refused MMR.

## 2022-05-15 NOTE — Discharge Summary (Signed)
Postpartum Discharge Summary  Date of Service updated03/08/2022     Patient Name: Miranda Ortiz DOB: 18-Jul-1995 MRN: [id number]  Date of admission: 05/13/2022 Delivery date:05/13/2022  Delivering provider: Bing Matter D  Date of discharge: 05/15/2022  Admitting diagnosis: Preterm labor [O60.00] Intrauterine pregnancy: [redacted]w[redacted]d    Secondary diagnosis:  Principal Problem:   Preterm labor  Additional problems: none     Discharge diagnosis: Preterm Pregnancy Delivered                                              Post partum procedures: na Augmentation: N/A Complications: None  Hospital course: Onset of Labor With Vaginal Delivery      27y.o. yo G1P0101 at 327w5das admitted in Active Labor on 05/13/2022. Labor course was complicated byPTL  Membrane Rupture Time/Date: 1:20 PM ,05/13/2022   Delivery Method:Vaginal, Spontaneous  Episiotomy: None  Lacerations:  1st degree  Patient had a postpartum course complicated by NOTHING.  She is ambulating, tolerating a regular diet, passing flatus, and urinating well. Patient is discharged home in stable condition on 05/15/22.  Newborn Data: Birth date:05/13/2022  Birth time:1:34 PM  Gender:Female  Living status:Living  Apgars:9 ,9  Weight:2100 g   Magnesium Sulfate received: No BMZ received: No Rhophylac:No MMR:Yes T-DaP:Given postpartum Flu: No Transfusion:No  Physical exam  Vitals:   05/14/22 0356 05/14/22 1453 05/14/22 2029 05/15/22 0543  BP: 106/68 115/82 119/75 103/62  Pulse: 72 85 72 81  Resp: '16  17 18  '$ Temp: 98.6 F (37 C) 98.4 F (36.9 C) 98.4 F (36.9 C) 98.5 F (36.9 C)  TempSrc: Oral Oral Oral Axillary  SpO2:   98%   Weight:      Height:       General: alert and cooperative Lochia: appropriate Uterine Fundus: firm Incision: N/A DVT Evaluation: Negative Homan's sign. Labs: Lab Results  Component Value Date   WBC 12.5 (H) 05/14/2022   HGB 10.9 (L) 05/14/2022   HCT 31.4 (L) 05/14/2022   MCV 86.3  05/14/2022   PLT 257 05/14/2022       No data to display         Edinburgh Score:    05/15/2022    3:14 AM  Edinburgh Postnatal Depression Scale Screening Tool  I have been able to laugh and see the funny side of things. 0  I have looked forward with enjoyment to things. 0  I have blamed myself unnecessarily when things went wrong. 1  I have been anxious or worried for no good reason. 0  I have felt scared or panicky for no good reason. 1  Things have been getting on top of me. 1  I have been so unhappy that I have had difficulty sleeping. 0  I have felt sad or miserable. 0  I have been so unhappy that I have been crying. 0  The thought of harming myself has occurred to me. 0  Edinburgh Postnatal Depression Scale Total 3      After visit meds:  Allergies as of 05/15/2022   No Known Allergies      Medication List     TAKE these medications    acetaminophen 325 MG tablet Commonly known as: Tylenol Take 2 tablets (650 mg total) by mouth every 4 (four) hours as needed (for pain scale < 4).   benzocaine-Menthol 20-0.5 %  Aero Commonly known as: DERMOPLAST Apply 1 Application topically as needed for irritation (perineal discomfort).   FOLIVANE-OB PO Take 1 capsule by mouth daily.   ibuprofen 600 MG tablet Commonly known as: ADVIL Take 1 tablet (600 mg total) by mouth every 6 (six) hours.   pantoprazole 20 MG tablet Commonly known as: PROTONIX Take 20 mg by mouth daily.   TUMS PO Take 1 tablet by mouth daily as needed (heartburn).         Discharge home in stable condition Infant Feeding: Bottle and Breast Infant Disposition: MAY NEED TO STAY WITH PEDS .  NEEDS CIRC PP. NOT FEEDING WELL IN HOSPITAL Discharge instruction: per After Visit Summary and Postpartum booklet. Activity: Advance as tolerated. Pelvic rest for 6 weeks.  Diet: routine diet Anticipated Birth Control: IUD Postpartum Appointment:6 weeks Additional Postpartum F/U:    Future  Appointments:No future appointments. Follow up Visit:  [facility] Follow up in 6 week(s).   Specialty: Obstetrics and Gynecology Contact information: [address] [place] [phone]                    05/15/2022 Betsy Coder, MD

## 2022-05-16 ENCOUNTER — Ambulatory Visit: Payer: Self-pay

## 2022-05-16 NOTE — Lactation Note (Signed)
This note was copied from a baby's chart.  NICU Lactation Consultation Note  Patient Name: Boy Necole Buen [id number] Date: 05/16/2022 Age:29 hours  Reason for consult: Follow-up assessment; 1st time breastfeeding; Primapara; NICU baby; Late-preterm 34-36.6wks; Infant < 6lbs  Subjective  Visited with family of 70 hours old LPI NICU female; Ms. Teater is a P1 and reports she hasn't been pumping consistently since her discharge. She got discharge yesterday and was able to pick up her pump from the [facility] office; she also has their pumping kit. Explained the importance of consistent pumping for the onset of lactogenesis II and the prevention of engorgement; she voiced understanding. Her goal is to primarily pump and bottle feed but she's also open to the possibility of latching baby to breast. Reviewed pumping schedule, pump settings, lactogenesis II, engorgement prevention/treatment and anticipatory guidelines.  Objective Infant data: Mother's Current Feeding Choice: Breast Milk and Formula  Infant feeding assessment Scale for Readiness: 2 Scale for Quality: 3  Maternal data: G1P0101  Vaginal, Spontaneous Current breast feeding challenges:: NICU admission Pumping frequency: 1 time/24 hours Pumped volume: 10 mL Flange Size: 24 Risk factor for low milk supply:: primipara, prematurity, infant separation What county?: [place] Program: Yes WIC Referral Sent?: Yes Pump: WIC Pump  Assessment Infant: Feeding Status: Scheduled 8-11-2-5  Maternal: Milk volume: Normal Patient has bilateral piercing on nipples; she voiced she removed both piercings when she found out she was pregnant but that the incision has not closed yet (but it's healed)  Intervention/Plan Interventions: Breast feeding basics reviewed; DEBP; Education Tools: Pump; Flanges Pump Education: Setup, frequency, and cleaning; Milk Storage Discharge Education: Engorgement and breast care  Plan of care: Encouraged  pumping every 3 hours, ideally 8 pumping sessions/24 hours She'll switch her pump settings from initiation to expression mode once she starts getting 20 ml of EBM combined She'll call for assistance when she's ready to take baby to breast  No other support person at this time. All questions and concerns answered, family to contact [facility] services PRN.  Consult Status: NICU follow-up  NICU Follow-up type: Verify onset of copious milk; Verify absence of engorgement; Weekly NICU follow up   [employer] 05/16/2022, 1:01 PM

## 2022-05-23 ENCOUNTER — Telehealth (HOSPITAL_COMMUNITY): Payer: Self-pay | Admitting: *Deleted

## 2022-05-23 ENCOUNTER — Ambulatory Visit: Payer: Medicaid Other

## 2022-05-23 NOTE — Telephone Encounter (Signed)
Hospital Discharge Follow-Up Call:  Patient reports that she is well and has no concerns about her healing process.  EPDS today was 7 and she endorses this accurately reflects that, overall, she is doing well emotionally.  Baby is in NICU.
